# Patient Record
Sex: Female | Born: 2000 | Race: Black or African American | Hispanic: No | Marital: Single | Smoking: Never smoker
Health system: Southern US, Community
[De-identification: ages and names within clinical notes are randomized; demographics above are authoritative.]

## PROBLEM LIST (undated history)

## (undated) DIAGNOSIS — J45909 Unspecified asthma, uncomplicated: Secondary | ICD-10-CM

---
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
## 2022-05-08 ENCOUNTER — Emergency Department (HOSPITAL_COMMUNITY): Payer: Medicaid Other

## 2022-05-08 ENCOUNTER — Emergency Department (HOSPITAL_COMMUNITY)
Admission: EM | Admit: 2022-05-08 | Discharge: 2022-05-08 | Disposition: A | Discharge status: Home / Self Care | Payer: Medicaid Other | Attending: Emergency Medicine | Admitting: Emergency Medicine

## 2022-05-08 ENCOUNTER — Other Ambulatory Visit: Payer: Self-pay

## 2022-05-08 DIAGNOSIS — S62336A Displaced fracture of neck of fifth metacarpal bone, right hand, initial encounter for closed fracture: Secondary | ICD-10-CM | POA: Insufficient documentation

## 2022-05-08 DIAGNOSIS — S6991XA Unspecified injury of right wrist, hand and finger(s), initial encounter: Secondary | ICD-10-CM | POA: Diagnosis present

## 2022-05-08 DIAGNOSIS — S60414A Abrasion of right ring finger, initial encounter: Secondary | ICD-10-CM | POA: Insufficient documentation

## 2022-05-08 DIAGNOSIS — Z23 Encounter for immunization: Secondary | ICD-10-CM | POA: Insufficient documentation

## 2022-05-08 DIAGNOSIS — M7989 Other specified soft tissue disorders: Secondary | ICD-10-CM | POA: Diagnosis not present

## 2022-05-08 DIAGNOSIS — W108XXA Fall (on) (from) other stairs and steps, initial encounter: Secondary | ICD-10-CM | POA: Insufficient documentation

## 2022-05-08 MED ORDER — HYDROCODONE-ACETAMINOPHEN 5-325 MG PO TABS
1.0000 | ORAL_TABLET | Freq: Once | ORAL | Status: AC
  Administered 2022-05-08: 1 via ORAL
  Filled 2022-05-08: qty 1

## 2022-05-08 MED ORDER — IBUPROFEN 400 MG PO TABS
400.0000 mg | ORAL_TABLET | Freq: Once | ORAL | Status: AC
  Administered 2022-05-08: 400 mg via ORAL

## 2022-05-08 MED ORDER — TETANUS-DIPHTH-ACELL PERTUSSIS 5-2.5-18.5 LF-MCG/0.5 IM SUSY
0.5000 mL | PREFILLED_SYRINGE | Freq: Once | INTRAMUSCULAR | Status: AC
  Administered 2022-05-08: 0.5 mL via INTRAMUSCULAR
  Filled 2022-05-08: qty 0.5

## 2022-05-08 MED ORDER — HYDROCODONE-ACETAMINOPHEN 5-325 MG PO TABS: 1.0000 | ORAL_TABLET | ORAL | 0 refills | Status: AC | PRN

## 2022-05-08 MED ORDER — IBUPROFEN 400 MG PO TABS
400.0000 mg | ORAL_TABLET | Freq: Once | ORAL | Status: AC
  Administered 2022-05-08: 400 mg via ORAL
  Filled 2022-05-08: qty 1

## 2022-05-08 MED ORDER — IBUPROFEN 400 MG PO TABS: 400.0000 mg | ORAL_TABLET | Freq: Four times a day (QID) | ORAL | 0 refills | Status: AC | PRN

## 2022-05-08 MED ORDER — IBUPROFEN 400 MG PO TABS
600.0000 mg | ORAL_TABLET | Freq: Once | ORAL | Status: DC
  Filled 2022-05-08: qty 1

## 2022-05-08 NOTE — ED Triage Notes (Signed)
Pt fell on concrete and landed on front part of hand. Scraped and swollen R hand

## 2022-05-08 NOTE — ED Provider Notes (Signed)
[facility] EMERGENCY DEPARTMENT Provider Note   CSN: [id number] Arrival date & time: 05/08/22  0038     History  Chief Complaint  Patient presents with   Hand Injury    Valerie Mccoy is a 21 y.o. female.  HPI 21 year old female present with right hand injury.  Occurred sometime around 11 PM last night.  She has been waiting in the waiting room for 8+ hours.  She states that she fell down some stairs but did not injure anything else.  When asked sure again after discussing her injury pattern she states that she actually punched a window pane.  It did not break and there was no glass injury.  She was given ibuprofen and Tdap immunization in the waiting room.  She feels better.  She has some dried scabs that she thinks are from abrasions and may be a laceration.  She denies any numbness.  Decreased range of motion due to pain. She is right handed.  Home Medications Prior to Admission medications   Medication Sig Start Date End Date Taking? Authorizing Provider  acetaminophen-codeine 120-12 MG/5ML solution Take 5 mLs by mouth every 4 (four) hours as needed for moderate pain. 03/30/13   Reuben Likes, MD  albuterol (PROVENTIL HFA;VENTOLIN HFA) 108 (90 BASE) MCG/ACT inhaler Inhale 2 puffs into the lungs every 6 (six) hours as needed for wheezing or shortness of breath.    [provider]  cetirizine (ZYRTEC) 5 MG tablet Take 5 mg by mouth daily.    [provider]      Allergies    Patient has no known allergies.    Review of Systems   Review of Systems  Musculoskeletal:  Positive for arthralgias and joint swelling.  Neurological:  Negative for weakness and numbness.    Physical Exam Updated Vital Signs BP 117/70 (BP Location: Right Arm)   Pulse (!) 104   Temp 98.1 F (36.7 C)   Resp 16   LMP 04/28/2022   SpO2 99%  Physical Exam Vitals and nursing note reviewed.  Constitutional:      Appearance: She is well-developed.  HENT:     Head:  Normocephalic and atraumatic.  Cardiovascular:     Rate and Rhythm: Normal rate and regular rhythm.     Pulses:          Radial pulses are 2+ on the right side.  Pulmonary:     Effort: Pulmonary effort is normal.  Musculoskeletal:     Right wrist: No tenderness. Normal range of motion.     Right hand: Swelling and tenderness present. Decreased range of motion. Normal sensation.     Comments: There is swelling over the 3rd-5th metacarpals. Intact sensation. She has some decreased flexion due to pain. However when she flexes at MCPs, there is no apparent rotational defect.  When scabs were cleaned by patient, she has 2 abrasions, one at 5th MCP and one at 4th finger. Neither is a laceration.  Skin:    General: Skin is warm and dry.  Neurological:     Mental Status: She is alert.     ED Results / Procedures / Treatments   Labs (all labs ordered are listed, but only abnormal results are displayed) Labs Reviewed - No data to display  EKG None  Radiology DG Hand Complete Right  Result Date: 05/08/2022 CLINICAL DATA:  Recent fall with fifth metacarpal pain, initial encounter EXAM: RIGHT HAND - COMPLETE 3+ VIEW COMPARISON:  None Available. FINDINGS: Angulated  distal fifth metacarpal fracture is noted. Mild soft tissue swelling is seen. No other focal abnormality is noted IMPRESSION: Distal fifth metacarpal fracture with soft tissue swelling. Electronically Signed   By: Inez Catalina M.D.   On: 05/08/2022 02:24    Procedures Procedures    Medications Ordered in ED Medications  HYDROcodone-acetaminophen (NORCO/VICODIN) 5-325 MG per tablet 1 tablet (has no administration in time range)  ibuprofen (ADVIL) tablet 600 mg (has no administration in time range)  ibuprofen (ADVIL) tablet 400 mg (400 mg Oral Given 05/08/22 0157)  Tdap (BOOSTRIX) injection 0.5 mL (0.5 mLs Intramuscular Given 05/08/22 0157)    ED Course/ Medical Decision Making/ A&P                           Medical  Decision Making Risk Prescription drug management.   Patient presents with a boxer's fracture.  This is closed.  She has small abrasions but no need for laceration repair.  Tdap was updated.  She was given ibuprofen with good relief.  Will give ibuprofen and hydrocodone here as is been multiple hours.  At this point, she will need splint and follow-up with hand surgery in about a week.  Will give return precautions but no apparent neurovascular deficit at this time.  No wrist injury.        Final Clinical Impression(s) / ED Diagnoses Final diagnoses:  Closed displaced fracture of neck of fifth metacarpal bone of right hand, initial encounter    Rx / DC Orders ED Discharge Orders     None         Sherwood Gambler, MD 05/08/22 [phone]

## 2022-05-08 NOTE — Discharge Instructions (Addendum)
Call the hand surgery office today to set up an appointment next week.  Do not get the splint wet.    If you develop numbness or change in color to your fingers, have new or worsening pain, or any other new/concerning symptoms then return to the ER for evaluation.  You are being given hydrocodone and ibuprofen for pain.  You may also take over-the-counter Tylenol.  The hydrocodone can make you sleepy, dizzy, or have other side effects and so do not drive or operate heavy machinery or combine with alcohol or other medicines.

## 2022-05-14 ENCOUNTER — Encounter (HOSPITAL_COMMUNITY): Payer: Self-pay | Admitting: *Deleted

## 2022-05-14 ENCOUNTER — Other Ambulatory Visit: Payer: Self-pay

## 2022-05-14 ENCOUNTER — Emergency Department (HOSPITAL_COMMUNITY)
Admission: EM | Admit: 2022-05-14 | Discharge: 2022-05-14 | Disposition: A | Discharge status: Home / Self Care | Payer: Medicaid Other | Attending: Emergency Medicine | Admitting: Emergency Medicine

## 2022-05-14 DIAGNOSIS — Z3202 Encounter for pregnancy test, result negative: Secondary | ICD-10-CM | POA: Insufficient documentation

## 2022-05-14 DIAGNOSIS — R112 Nausea with vomiting, unspecified: Secondary | ICD-10-CM | POA: Diagnosis present

## 2022-05-14 LAB — PREGNANCY, URINE: Preg Test, Ur: NEGATIVE

## 2022-05-14 NOTE — ED Provider Notes (Signed)
[facility]-EMERGENCY DEPT Provider Note   CSN: [id number] Arrival date & time: 05/14/22  1315     History {Add pertinent medical, surgical, social history, OB history to HPI:1} Chief Complaint  Patient presents with   Possible Pregnancy    Valerie Mccoy is a 21 y.o. female.  Patient is here with 2 complaints.  1 is that she would like a sling for her right arm fracture that she was seen for at [facility].  She is not having any problems with that she just wants the sling to remind her not to use her arm.  She also said yesterday she had an episode of nausea and vomiting and she is concerned she might be pregnant.  No abdominal pain.  No fevers or chills.  The nausea is resolved and she is eating right now.  Last menstrual period was less than 3 weeks ago.  No vaginal bleeding or discharge.  The history is provided by the patient.  Possible Pregnancy This is a new problem. The current episode started yesterday. The problem has been resolved. Pertinent negatives include no chest pain, no abdominal pain, no headaches and no shortness of breath. Nothing aggravates the symptoms. Nothing relieves the symptoms. She has tried rest for the symptoms. The treatment provided moderate relief.       Home Medications Prior to Admission medications   Medication Sig Start Date End Date Taking? Authorizing Provider  albuterol (PROVENTIL HFA;VENTOLIN HFA) 108 (90 BASE) MCG/ACT inhaler Inhale 2 puffs into the lungs every 6 (six) hours as needed for wheezing or shortness of breath.    [provider]  cetirizine (ZYRTEC) 5 MG tablet Take 5 mg by mouth daily.    [provider]  HYDROcodone-acetaminophen (NORCO) 5-325 MG tablet Take 1 tablet by mouth every 4 (four) hours as needed. 05/08/22   Pricilla Loveless, MD  ibuprofen (ADVIL) 400 MG tablet Take 1 tablet (400 mg total) by mouth every 6 (six) hours as needed. 05/08/22   Pricilla Loveless, MD      Allergies    Patient has  no known allergies.    Review of Systems   Review of Systems  Constitutional:  Negative for fever.  Respiratory:  Negative for shortness of breath.   Cardiovascular:  Negative for chest pain.  Gastrointestinal:  Positive for nausea and vomiting. Negative for abdominal pain.  Genitourinary:  Negative for dysuria, vaginal bleeding and vaginal discharge.  Neurological:  Negative for headaches.    Physical Exam Updated Vital Signs BP 129/74   Pulse 89   Temp 98.2 F (36.8 C) (Oral)   Resp 16   Ht 5\' 1"  (1.549 m)   Wt 45.8 kg   LMP 04/28/2022   SpO2 97%   BMI 19.08 kg/m  Physical Exam Constitutional:      Appearance: Normal appearance. She is well-developed.  HENT:     Head: Normocephalic and atraumatic.  Eyes:     Conjunctiva/sclera: Conjunctivae normal.  Musculoskeletal:     Cervical back: Neck supple.     Comments: Right arm in an ulnar gutter.  Normal CSM  Skin:    General: Skin is warm and dry.  Neurological:     General: No focal deficit present.     Mental Status: She is alert.     GCS: GCS eye subscore is 4. GCS verbal subscore is 5. GCS motor subscore is 6.     ED Results / Procedures / Treatments   Labs (all labs ordered are  listed, but only abnormal results are displayed) Labs Reviewed  PREGNANCY, URINE  POC URINE PREG, ED    EKG None  Radiology No results found.  Procedures Procedures  {Document cardiac monitor, telemetry assessment procedure when appropriate:1}  Medications Ordered in ED Medications - No data to display  ED Course/ Medical Decision Making/ A&P                           Medical Decision Making  ***  {Document critical care time when appropriate:1} {Document review of labs and clinical decision tools ie heart score, Chads2Vasc2 etc:1}  {Document your independent review of radiology images, and any outside records:1} {Document your discussion with family members, caretakers, and with consultants:1} {Document social  determinants of health affecting pt's care:1} {Document your decision making why or why not admission, treatments were needed:1} Final Clinical Impression(s) / ED Diagnoses Final diagnoses:  None    Rx / DC Orders ED Discharge Orders     None

## 2022-05-14 NOTE — ED Triage Notes (Signed)
Thinks she may be pregnant since she threw up yesterday, last menstrual cycle beginning of this month. Poor historian

## 2022-05-14 NOTE — Discharge Instructions (Addendum)
You were seen in the emergency department for an episode of nausea and vomiting.  Your pregnancy test was negative.  You were also given a sling.  Please follow-up with hand surgery as directed prior.  Return if any concerns

## 2022-05-25 ENCOUNTER — Emergency Department (HOSPITAL_COMMUNITY)
Admission: EM | Admit: 2022-05-25 | Discharge: 2022-05-25 | Disposition: A | Discharge status: Home / Self Care | Payer: Medicaid Other | Attending: Emergency Medicine | Admitting: Emergency Medicine

## 2022-05-25 ENCOUNTER — Other Ambulatory Visit: Payer: Self-pay

## 2022-05-25 ENCOUNTER — Encounter (HOSPITAL_COMMUNITY): Payer: Self-pay | Admitting: Emergency Medicine

## 2022-05-25 DIAGNOSIS — Z202 Contact with and (suspected) exposure to infections with a predominantly sexual mode of transmission: Secondary | ICD-10-CM | POA: Insufficient documentation

## 2022-05-25 LAB — PREGNANCY, URINE: Preg Test, Ur: NEGATIVE

## 2022-05-25 LAB — HIV ANTIBODY (ROUTINE TESTING W REFLEX): HIV Screen 4th Generation wRfx: NONREACTIVE

## 2022-05-25 MED ORDER — METRONIDAZOLE 500 MG PO TABS: 500.0000 mg | ORAL_TABLET | Freq: Two times a day (BID) | ORAL | 0 refills | Status: AC

## 2022-05-25 NOTE — ED Provider Notes (Signed)
[facility]-EMERGENCY DEPT Provider Note   CSN: [id number] Arrival date & time: 05/25/22  0006     History  Chief Complaint  Patient presents with   Exposure to STD    Valerie Mccoy is a 21 y.o. female.   Exposure to STD     Patient presents to the emergency department due to trichomoniasis exposure from her partner.  She is asymptomatic, denies any vaginal discharge, pelvic pain, abdominal pain, nausea or vomiting.  No other sexual encounters.  Home Medications Prior to Admission medications   Medication Sig Start Date End Date Taking? Authorizing Provider  metroNIDAZOLE (FLAGYL) 500 MG tablet Take 1 tablet (500 mg total) by mouth 2 (two) times daily. 05/25/22  Yes Theron Arista, PA-C  albuterol (PROVENTIL HFA;VENTOLIN HFA) 108 (90 BASE) MCG/ACT inhaler Inhale 2 puffs into the lungs every 6 (six) hours as needed for wheezing or shortness of breath.    [provider]  cetirizine (ZYRTEC) 5 MG tablet Take 5 mg by mouth daily.    [provider]  HYDROcodone-acetaminophen (NORCO) 5-325 MG tablet Take 1 tablet by mouth every 4 (four) hours as needed. 05/08/22   Pricilla Loveless, MD  ibuprofen (ADVIL) 400 MG tablet Take 1 tablet (400 mg total) by mouth every 6 (six) hours as needed. 05/08/22   Pricilla Loveless, MD      Allergies    Patient has no known allergies.    Review of Systems   Review of Systems  Physical Exam Updated Vital Signs BP 132/82 (BP Location: Right Arm)   Pulse 97   Temp 99.2 F (37.3 C) (Oral)   Resp 18   Ht 5\' 1"  (1.549 m)   Wt 45.8 kg   LMP 04/28/2022 (Exact Date)   SpO2 99%   BMI 19.08 kg/m  Physical Exam Vitals and nursing note reviewed.  Constitutional:      General: She is not in acute distress.    Appearance: She is well-developed.  HENT:     Head: Normocephalic and atraumatic.  Eyes:     Conjunctiva/sclera: Conjunctivae normal.  Cardiovascular:     Rate and Rhythm: Normal rate and regular rhythm.      Heart sounds: No murmur heard. Pulmonary:     Effort: Pulmonary effort is normal. No respiratory distress.     Breath sounds: Normal breath sounds.  Abdominal:     Palpations: Abdomen is soft.     Tenderness: There is no abdominal tenderness.  Musculoskeletal:        General: No swelling.     Cervical back: Neck supple.  Skin:    General: Skin is warm and dry.     Capillary Refill: Capillary refill takes less than 2 seconds.  Neurological:     Mental Status: She is alert.  Psychiatric:        Mood and Affect: Mood normal.     ED Results / Procedures / Treatments   Labs (all labs ordered are listed, but only abnormal results are displayed) Labs Reviewed  PREGNANCY, URINE  HIV ANTIBODY (ROUTINE TESTING W REFLEX)  GC/CHLAMYDIA PROBE AMP ([place]) NOT AT [facility]    EKG None  Radiology No results found.  Procedures Procedures    Medications Ordered in ED Medications - No data to display  ED Course/ Medical Decision Making/ A&P                           Medical Decision Making  Amount and/or Complexity of Data Reviewed Labs: ordered.  Risk Prescription drug management.   Patient presents due to exposure to trichomoniasis.   She is asymptomatic, doubt pelvic inflammatory disease.  She is not pregnant.  HIV, GC and chlamydia test were obtained in triage.  Pending at time of my evaluation.   Will cover her with Flagyl for trichomoniasis, stable for outpatient follow-up at this time.  We discussed sexual abstinence until resolution of symptoms confirmed by testing, outpatient follow-up plan,, standing from alcohol while on the antibiotic.  Stable for discharge with community at this point.        Final Clinical Impression(s) / ED Diagnoses Final diagnoses:  STD exposure    Rx / DC Orders ED Discharge Orders          Ordered    metroNIDAZOLE (FLAGYL) 500 MG tablet  2 times daily        05/25/22 0730              Theron Arista, PA-C 05/25/22  [id number]    Tegeler, Canary Brim, MD 05/25/22 [phone]

## 2022-05-25 NOTE — ED Triage Notes (Addendum)
Pt arriving requesting STD testing. Pt states her partner called her stating they were diagnosed with trich. Pt has no symptoms.

## 2022-05-25 NOTE — Discharge Instructions (Signed)
Take the antibiotic twice daily for the next week.  Abstain from any sexual intercourse until you are screened in a week or so after finishing the antibiotic to confirm resolution of the trichomoniasis.  Return to the ED for new or concerning symptoms.  Follow-up with your main doctor or [facility] health department for confirmation of resolution of the trichomoniasis.

## 2022-05-27 ENCOUNTER — Other Ambulatory Visit: Payer: Self-pay

## 2022-05-27 ENCOUNTER — Emergency Department (HOSPITAL_COMMUNITY)
Admission: EM | Admit: 2022-05-27 | Discharge: 2022-05-28 | Disposition: A | Discharge status: Home / Self Care | Payer: Medicaid Other | Attending: Emergency Medicine | Admitting: Emergency Medicine

## 2022-05-27 DIAGNOSIS — Z4689 Encounter for fitting and adjustment of other specified devices: Secondary | ICD-10-CM | POA: Insufficient documentation

## 2022-05-27 DIAGNOSIS — X58XXXD Exposure to other specified factors, subsequent encounter: Secondary | ICD-10-CM | POA: Diagnosis not present

## 2022-05-27 DIAGNOSIS — M79642 Pain in left hand: Secondary | ICD-10-CM | POA: Insufficient documentation

## 2022-05-27 DIAGNOSIS — S62339D Displaced fracture of neck of unspecified metacarpal bone, subsequent encounter for fracture with routine healing: Secondary | ICD-10-CM

## 2022-05-27 DIAGNOSIS — S6992XD Unspecified injury of left wrist, hand and finger(s), subsequent encounter: Secondary | ICD-10-CM | POA: Diagnosis present

## 2022-05-27 DIAGNOSIS — S62317D Displaced fracture of base of fifth metacarpal bone. left hand, subsequent encounter for fracture with routine healing: Secondary | ICD-10-CM | POA: Diagnosis not present

## 2022-05-27 LAB — GC/CHLAMYDIA PROBE AMP (~~LOC~~) NOT AT ARMC
Chlamydia: NEGATIVE
Comment: NEGATIVE
Comment: NORMAL
Neisseria Gonorrhea: NEGATIVE

## 2022-05-27 NOTE — ED Triage Notes (Signed)
Pt requesting for cast to be reapplied due to cast being dirty.

## 2022-05-28 NOTE — ED Provider Notes (Signed)
  [facility] EMERGENCY DEPARTMENT Provider Note   CSN: [id number] Arrival date & time: 05/27/22  2244     History  Chief Complaint  Patient presents with   Cast Problem    Valerie Mccoy is a 22 y.o. female.  Broke her hand on 12/14. Has not followed up with hand surgery. Would like splint replaced because it is dirty. No other complaints.       Home Medications Prior to Admission medications   Medication Sig Start Date End Date Taking? Authorizing Provider  albuterol (PROVENTIL HFA;VENTOLIN HFA) 108 (90 BASE) MCG/ACT inhaler Inhale 2 puffs into the lungs every 6 (six) hours as needed for wheezing or shortness of breath.    [provider]  cetirizine (ZYRTEC) 5 MG tablet Take 5 mg by mouth daily.    [provider]  HYDROcodone-acetaminophen (NORCO) 5-325 MG tablet Take 1 tablet by mouth every 4 (four) hours as needed. 05/08/22   Sherwood Gambler, MD  ibuprofen (ADVIL) 400 MG tablet Take 1 tablet (400 mg total) by mouth every 6 (six) hours as needed. 05/08/22   Sherwood Gambler, MD  metroNIDAZOLE (FLAGYL) 500 MG tablet Take 1 tablet (500 mg total) by mouth 2 (two) times daily. 05/25/22   Sherrill Raring, PA-C      Allergies    Chocolate hazelnut flavor, Other, and Shellfish allergy    Review of Systems   Review of Systems  Physical Exam Updated Vital Signs BP (!) 140/73 (BP Location: Left Arm)   Pulse 85   Temp 98.4 F (36.9 C) (Oral)   Resp 18   Ht 5\' 1"  (1.549 m)   Wt 45.8 kg   LMP 04/28/2022 (Exact Date)   SpO2 97%   BMI 19.08 kg/m  Physical Exam Constitutional:      Appearance: Normal appearance.  Musculoskeletal:     Left hand: Tenderness present. No swelling or deformity. Decreased range of motion. Normal sensation. There is no disruption of two-point discrimination. Normal capillary refill.  Skin:    General: Skin is warm and dry.     Findings: No rash.  Neurological:     Mental Status: She is alert.     Cranial Nerves:  Cranial nerves 2-12 are intact.     Sensory: Sensation is intact.     Motor: Motor function is intact.     ED Results / Procedures / Treatments   Labs (all labs ordered are listed, but only abnormal results are displayed) Labs Reviewed - No data to display  EKG None  Radiology No results found.  Procedures Procedures    Medications Ordered in ED Medications - No data to display  ED Course/ Medical Decision Making/ A&P                           Medical Decision Making  Neurovascularly intact. Webrill and ace wraps replaced using existing orthoglass ulnar gutter splint.         Final Clinical Impression(s) / ED Diagnoses Final diagnoses:  Closed boxer's fracture with routine healing, subsequent encounter    Rx / DC Orders ED Discharge Orders     None         Charisma Charlot, Gwenyth Allegra, MD 05/28/22 [phone]

## 2022-05-31 ENCOUNTER — Emergency Department (HOSPITAL_COMMUNITY)
Admission: EM | Admit: 2022-05-31 | Discharge: 2022-05-31 | Disposition: A | Discharge status: Home / Self Care | Payer: Medicaid Other | Attending: Emergency Medicine | Admitting: Emergency Medicine

## 2022-05-31 ENCOUNTER — Other Ambulatory Visit: Payer: Self-pay

## 2022-05-31 DIAGNOSIS — Z1152 Encounter for screening for COVID-19: Secondary | ICD-10-CM | POA: Insufficient documentation

## 2022-05-31 DIAGNOSIS — J45909 Unspecified asthma, uncomplicated: Secondary | ICD-10-CM | POA: Insufficient documentation

## 2022-05-31 DIAGNOSIS — J069 Acute upper respiratory infection, unspecified: Secondary | ICD-10-CM | POA: Insufficient documentation

## 2022-05-31 DIAGNOSIS — R110 Nausea: Secondary | ICD-10-CM | POA: Insufficient documentation

## 2022-05-31 DIAGNOSIS — R059 Cough, unspecified: Secondary | ICD-10-CM | POA: Diagnosis present

## 2022-05-31 DIAGNOSIS — Z7951 Long term (current) use of inhaled steroids: Secondary | ICD-10-CM | POA: Insufficient documentation

## 2022-05-31 LAB — RESP PANEL BY RT-PCR (RSV, FLU A&B, COVID)  RVPGX2
Influenza A by PCR: NEGATIVE
Influenza B by PCR: NEGATIVE
Resp Syncytial Virus by PCR: NEGATIVE
SARS Coronavirus 2 by RT PCR: NEGATIVE

## 2022-05-31 LAB — PREGNANCY, URINE: Preg Test, Ur: NEGATIVE

## 2022-05-31 MED ORDER — ONDANSETRON 4 MG PO TBDP: 4.0000 mg | ORAL_TABLET | Freq: Three times a day (TID) | ORAL | 0 refills | Status: DC | PRN

## 2022-05-31 MED ORDER — ONDANSETRON 4 MG PO TBDP: 4.0000 mg | ORAL_TABLET | Freq: Three times a day (TID) | ORAL | 0 refills | Status: AC | PRN

## 2022-05-31 MED ORDER — ONDANSETRON 8 MG PO TBDP
8.0000 mg | ORAL_TABLET | Freq: Once | ORAL | Status: AC
  Administered 2022-05-31: 8 mg via ORAL
  Filled 2022-05-31: qty 1

## 2022-05-31 MED ORDER — ACETAMINOPHEN 325 MG PO TABS
650.0000 mg | ORAL_TABLET | Freq: Once | ORAL | Status: AC
  Administered 2022-05-31: 650 mg via ORAL
  Filled 2022-05-31: qty 2

## 2022-05-31 NOTE — ED Triage Notes (Signed)
Patient reports 2 days of cough, sore throat, and feeling unwell, hot and cold chills. Has been taking Dayquil. Wanted to see if we could also re-apply her cast since her cast is dirty / messed up.

## 2022-05-31 NOTE — Discharge Instructions (Addendum)
Note the workup today was overall reassuring.  Urine pregnancy was negative.  Your viral swab was negative for COVID,FLU,RSV.  Symptoms most likely secondary to upper respiratory viral illness.  Recommend symptomatic therapy at home with Tylenol/Motrin as needed for pain/fever, Zofran as needed for nausea, nasal steroid spray in the form of Nasacort/Flonase, daily allergy medicine in the form of Claritin/Allegra/Zyrtec, over-the-counter Cepacol throat lozenges as needed for sore throat.  Recommend follow-up with primary care for reevaluation.  Please do not hesitate to return to emergency department for worrisome signs and symptoms we discussed become apparent.

## 2022-05-31 NOTE — ED Provider Notes (Signed)
[facility]-EMERGENCY DEPT Provider Note   CSN: [id number] Arrival date & time: 05/31/22  1313     History  Chief Complaint  Patient presents with   Cough   Sore Throat    Valerie Mccoy is a 22 y.o. female.   Cough Sore Throat   22 year old female presents emergency department with complaints of cough, sore throat, subjective fever/chills at home.  Patient states that symptoms been present over the last 2 to 3 days.  Reports taking DayQuil which has helped some.  Reports known sick exposure through her partner who had similar symptoms.  Denies chest pain, shortness of breath, abdominal pain, vomiting, urinary/vaginal symptoms, change in bowel habits.  Patient states that she is she is somewhat concerned for pregnancy and is requesting for testing.  Reports some associated nausea with no emesis.  Past medical history significant for asthma.  Home Medications Prior to Admission medications   Medication Sig Start Date End Date Taking? Authorizing Provider  ondansetron (ZOFRAN-ODT) 4 MG disintegrating tablet Take 1 tablet (4 mg total) by mouth every 8 (eight) hours as needed for nausea or vomiting. 05/31/22  Yes Sherian Maroon A, PA  albuterol (PROVENTIL HFA;VENTOLIN HFA) 108 (90 BASE) MCG/ACT inhaler Inhale 2 puffs into the lungs every 6 (six) hours as needed for wheezing or shortness of breath.    [provider]  cetirizine (ZYRTEC) 5 MG tablet Take 5 mg by mouth daily.    [provider]  HYDROcodone-acetaminophen (NORCO) 5-325 MG tablet Take 1 tablet by mouth every 4 (four) hours as needed. 05/08/22   Pricilla Loveless, MD  ibuprofen (ADVIL) 400 MG tablet Take 1 tablet (400 mg total) by mouth every 6 (six) hours as needed. 05/08/22   Pricilla Loveless, MD  metroNIDAZOLE (FLAGYL) 500 MG tablet Take 1 tablet (500 mg total) by mouth 2 (two) times daily. 05/25/22   Theron Arista, PA-C      Allergies    Chocolate hazelnut flavor, Other, and Shellfish  allergy    Review of Systems   Review of Systems  Respiratory:  Positive for cough.   All other systems reviewed and are negative.   Physical Exam Updated Vital Signs BP (!) 134/93   Pulse 99   Temp 99 F (37.2 C) (Oral)   Resp 18   LMP 04/28/2022 (Exact Date)   SpO2 100%  Physical Exam Vitals and nursing note reviewed.  Constitutional:      General: She is not in acute distress.    Appearance: She is well-developed.  HENT:     Head: Normocephalic and atraumatic.     Nose: Congestion and rhinorrhea present.     Mouth/Throat:     Pharynx: Posterior oropharyngeal erythema present.     Comments: Mild posterior pharyngeal erythema noted.  Tonsils 0-1+ bilaterally with no obvious exudate.  Uvula midline and rises symmetrically with phonation.  No sublingual or submandibular swelling appreciated. Eyes:     Conjunctiva/sclera: Conjunctivae normal.  Cardiovascular:     Rate and Rhythm: Normal rate and regular rhythm.     Heart sounds: No murmur heard. Pulmonary:     Effort: Pulmonary effort is normal. No respiratory distress.     Breath sounds: Normal breath sounds. No stridor. No wheezing, rhonchi or rales.  Abdominal:     Palpations: Abdomen is soft.     Tenderness: There is no abdominal tenderness.  Musculoskeletal:        General: No swelling.     Cervical back: Neck supple.  No rigidity or tenderness.     Right lower leg: No edema.     Left lower leg: No edema.  Skin:    General: Skin is warm and dry.     Capillary Refill: Capillary refill takes less than 2 seconds.  Neurological:     Mental Status: She is alert.  Psychiatric:        Mood and Affect: Mood normal.     ED Results / Procedures / Treatments   Labs (all labs ordered are listed, but only abnormal results are displayed) Labs Reviewed  RESP PANEL BY RT-PCR (RSV, FLU A&B, COVID)  RVPGX2  PREGNANCY, URINE    EKG None  Radiology No results found.  Procedures Procedures    Medications Ordered  in ED Medications  ondansetron (ZOFRAN-ODT) disintegrating tablet 8 mg (8 mg Oral Given 05/31/22 1409)  acetaminophen (TYLENOL) tablet 650 mg (650 mg Oral Given 05/31/22 1409)    ED Course/ Medical Decision Making/ A&P                           Medical Decision Making Amount and/or Complexity of Data Reviewed Labs: ordered.  Risk OTC drugs. Prescription drug management.   This patient presents to the ED for concern of flulike illness, this involves an extensive number of treatment options, and is a complaint that carries with it a high risk of complications and morbidity.  The differential diagnosis includes influenza, COVID, RSV, pneumonia, sepsis, meningitis, malignancy   Co morbidities that complicate the patient evaluation  See HPI   Additional history obtained:  Additional history obtained from EMR External records from outside source obtained and reviewed including hospital records   Lab Tests:  I Ordered, and personally interpreted labs.  The pertinent results include: Urine pregnancy negative.  Respiratory viral panel negative for COVID, influenza, RSV   Imaging Studies ordered:  N/a   Cardiac Monitoring: / EKG:  The patient was maintained on a cardiac monitor.  I personally viewed and interpreted the cardiac monitored which showed an underlying rhythm of: Sinus rhythm   Consultations Obtained:  N/a   Problem List / ED Course / Critical interventions / Medication management  URI with cough I ordered medication including ibuprofen   Reevaluation of the patient after these medicines showed that the patient improved I have reviewed the patients home medicines and have made adjustments as needed   Social Determinants of Health:  Denies tobacco, licit drug use   Test / Admission - Considered:  URI with cough Vitals signs  within normal range and stable throughout visit. Laboratory studies significant for: See above Patient with symptoms most likely  secondary to upper respiratory viral illness.  Recommend symptomatic therapy at home with Tylenol/Motrin as needed for pain/fever, nasal steroid spray, antihistamine, typical throat lozenges.  Recommend follow-up with primary care for reevaluation.  Treatment plan discussed with patient and she acknowledged understanding was agreeable to said plan. Worrisome signs and symptoms were discussed with the patient, and the patient acknowledged understanding to return to the ED if noticed. Patient was stable upon discharge.          Final Clinical Impression(s) / ED Diagnoses Final diagnoses:  Viral upper respiratory tract infection    Rx / DC Orders ED Discharge Orders          Ordered    ondansetron (ZOFRAN-ODT) 4 MG disintegrating tablet  Every 8 hours PRN        05/31/22 1437  Wilnette Kales, [place] 05/31/22 1456    Lajean Saver, MD 05/31/22 2051
